# Patient Record
Sex: Female | Born: 1950 | Race: White | Hispanic: No | Marital: Married | Smoking: Never smoker
Health system: Southern US, Community
[De-identification: ages and names within clinical notes are randomized; demographics above are authoritative.]

## PROBLEM LIST (undated history)

## (undated) DIAGNOSIS — Z789 Other specified health status: Secondary | ICD-10-CM

## (undated) HISTORY — PX: TUBAL LIGATION: SHX77

## (undated) HISTORY — PX: NISSEN FUNDOPLICATION: SHX2091

## (undated) HISTORY — PX: APPENDECTOMY: SHX54

---

## 2009-08-23 ENCOUNTER — Emergency Department (HOSPITAL_COMMUNITY): Admission: EM | Admit: 2009-08-23 | Discharge: 2009-08-24 | Payer: Self-pay | Admitting: Emergency Medicine

## 2010-08-19 LAB — URINALYSIS, ROUTINE W REFLEX MICROSCOPIC
Bilirubin Urine: NEGATIVE
Glucose, UA: NEGATIVE mg/dL
Hgb urine dipstick: NEGATIVE
Protein, ur: NEGATIVE mg/dL

## 2010-08-19 LAB — POCT CARDIAC MARKERS
CKMB, poc: <1 ng/mL — ABNORMAL LOW (ref 1.0–8.0)
CKMB, poc: <1 ng/mL — ABNORMAL LOW (ref 1.0–8.0)
Myoglobin, poc: 50.4 ng/mL (ref 12–200)
Myoglobin, poc: 54.5 ng/mL (ref 12–200)
Troponin i, poc: <0.05 ng/mL (ref 0.00–0.09)

## 2010-08-19 LAB — DIFFERENTIAL
Basophils Absolute: 0 10*3/uL (ref 0.0–0.1)
Basophils Relative: 1 % (ref 0–1)
Lymphocytes Relative: 40 % (ref 12–46)
Neutro Abs: 2.6 10*3/uL (ref 1.7–7.7)
Neutrophils Relative %: 47 % (ref 43–77)

## 2010-08-19 LAB — CBC
HCT: 38.8 % (ref 36.0–46.0)
Hemoglobin: 13.5 g/dL (ref 12.0–15.0)
MCV: 86.3 fL (ref 78.0–100.0)
RDW: 13.2 % (ref 11.5–15.5)
WBC: 5.5 10*3/uL (ref 4.0–10.5)

## 2010-08-19 LAB — COMPREHENSIVE METABOLIC PANEL
ALT: 23 U/L (ref 0–35)
AST: 23 U/L (ref 0–37)
BUN: 15 mg/dL (ref 6–23)
CO2: 24 mEq/L (ref 19–32)
GFR calc non Af Amer: >60 mL/min (ref >60)
Glucose, Bld: 89 mg/dL (ref 70–99)

## 2010-08-19 LAB — LIPASE, BLOOD: Lipase: 17 U/L (ref 11–59)

## 2010-09-26 ENCOUNTER — Other Ambulatory Visit: Payer: Self-pay | Admitting: Family Medicine

## 2010-09-26 ENCOUNTER — Other Ambulatory Visit (HOSPITAL_COMMUNITY)
Admission: RE | Admit: 2010-09-26 | Discharge: 2010-09-26 | Disposition: A | Discharge status: Home / Self Care | Payer: BC Managed Care – PPO | Source: Ambulatory Visit | Attending: Family Medicine | Admitting: Family Medicine

## 2010-09-26 DIAGNOSIS — Z124 Encounter for screening for malignant neoplasm of cervix: Secondary | ICD-10-CM | POA: Insufficient documentation

## 2011-06-13 ENCOUNTER — Emergency Department (HOSPITAL_COMMUNITY)
Admission: EM | Admit: 2011-06-13 | Discharge: 2011-06-13 | Disposition: A | Discharge status: Home / Self Care | Payer: BC Managed Care – PPO | Source: Home / Self Care | Attending: Family Medicine | Admitting: Family Medicine

## 2011-06-13 ENCOUNTER — Emergency Department (INDEPENDENT_AMBULATORY_CARE_PROVIDER_SITE_OTHER): Payer: BC Managed Care – PPO

## 2011-06-13 ENCOUNTER — Encounter (HOSPITAL_COMMUNITY): Payer: Self-pay

## 2011-06-13 DIAGNOSIS — S82402A Unspecified fracture of shaft of left fibula, initial encounter for closed fracture: Secondary | ICD-10-CM

## 2011-06-13 DIAGNOSIS — S82409A Unspecified fracture of shaft of unspecified fibula, initial encounter for closed fracture: Secondary | ICD-10-CM

## 2011-06-13 MED ORDER — HYDROCODONE-ACETAMINOPHEN 5-500 MG PO TABS: 1.0000 | ORAL_TABLET | Freq: Four times a day (QID) | ORAL | Status: AC | PRN

## 2011-06-13 NOTE — ED Provider Notes (Signed)
History     CSN: [id number]  Arrival date & time 06/13/11  1010   First MD Initiated Contact with Patient 06/13/11 1026      Chief Complaint  Patient presents with  . Fall    (Consider location/radiation/quality/duration/timing/severity/associated sxs/prior treatment) Patient is a 61 y.o. female presenting with ankle pain. The history is provided by the patient.  Ankle Pain  The incident occurred yesterday. The incident occurred at home. The injury mechanism was a fall. The pain is present in the left ankle. The quality of the pain is described as throbbing. The pain is mild. The pain has been constant since onset. Pertinent negatives include no numbness, no loss of sensation and no tingling. She reports no foreign bodies present.    History reviewed. No pertinent past medical history.  History reviewed. No pertinent past surgical history.  History reviewed. No pertinent family history.  History  Substance Use Topics  . Smoking status: Not on file  . Smokeless tobacco: Not on file  . Alcohol Use: Not on file    OB History    Grav Para Term Preterm Abortions TAB SAB Ect Mult Living                  Review of Systems  Constitutional: Negative.   Musculoskeletal: Positive for joint swelling and gait problem.  Neurological: Negative.  Negative for tingling and numbness.    Allergies  Review of patient's allergies indicates no known allergies.  Home Medications   Current Outpatient Rx  Name Route Sig Dispense Refill  . HYDROCODONE-ACETAMINOPHEN 5-500 MG PO TABS Oral Take 1-2 tablets by mouth every 6 (six) hours as needed for pain. 15 tablet 0    BP 133/53  Pulse 79  Temp(Src) 98.3 F (36.8 C) (Oral)  Resp 20  SpO2 96%  Physical Exam  Nursing note and vitals reviewed. Constitutional: She is oriented to person, place, and time. She appears well-developed and well-nourished.  HENT:  Head: Normocephalic and atraumatic.  Musculoskeletal: She exhibits  tenderness.       Feet:  Neurological: She is alert and oriented to person, place, and time.    ED Course  Procedures (including critical care time)  Labs Reviewed - No data to display Dg Ankle Complete Left  06/13/2011  *RADIOLOGY REPORT*  Clinical Data: Trauma and pain.  LEFT ANKLE COMPLETE - 3+ VIEW  Comparison: None.  Findings: Moderate medial and marked lateral malleolar soft tissue swelling.  Minimally displaced oblique fracture of the distal fibula.  Talar dome and base of fifth metatarsal are intact.  Lucency extending more medially and superiorly within the distal fibula is favored to be a nutrient foramen.  Small joint effusion suspected.  IMPRESSION: Minimally-displaced distal fibular fracture.  Overlying soft tissue swelling.  Original Report Authenticated By: Consuello Bossier, M.D.     1. Closed left fibular fracture       MDM  X-rays reviewed and report per radiologist.         Barkley Bruns, MD 06/13/11 [phone]

## 2011-06-13 NOTE — Discharge Instructions (Signed)
Wear boot when walking, use ice and elevate to reduce swelling over weekend, see orthopedist in 10 days for recheck.

## 2011-06-13 NOTE — ED Notes (Signed)
States she fell last night while walking her dog, injured left foot/ankle. C/o pain w certain positions or weight bearing ; + dorsal pedal pulse ,minimal swelling . Used motrin 800 w some relief. Denies other injury, denies head injury or LOC

## 2012-05-31 ENCOUNTER — Other Ambulatory Visit: Payer: Self-pay | Admitting: Family Medicine

## 2012-05-31 DIAGNOSIS — R1010 Upper abdominal pain, unspecified: Secondary | ICD-10-CM

## 2012-06-03 ENCOUNTER — Ambulatory Visit
Admission: RE | Admit: 2012-06-03 | Discharge: 2012-06-03 | Disposition: A | Discharge status: Home / Self Care | Payer: BC Managed Care – PPO | Source: Ambulatory Visit | Attending: Family Medicine | Admitting: Family Medicine

## 2012-06-03 DIAGNOSIS — R1010 Upper abdominal pain, unspecified: Secondary | ICD-10-CM

## 2014-01-26 ENCOUNTER — Other Ambulatory Visit: Payer: Self-pay | Admitting: Family Medicine

## 2014-01-26 ENCOUNTER — Other Ambulatory Visit (HOSPITAL_COMMUNITY)
Admission: RE | Admit: 2014-01-26 | Discharge: 2014-01-26 | Disposition: A | Discharge status: Home / Self Care | Payer: BC Managed Care – PPO | Source: Ambulatory Visit | Attending: Family Medicine | Admitting: Family Medicine

## 2014-01-26 DIAGNOSIS — Z124 Encounter for screening for malignant neoplasm of cervix: Secondary | ICD-10-CM | POA: Insufficient documentation

## 2014-01-31 LAB — CYTOLOGY - PAP

## 2014-02-01 ENCOUNTER — Other Ambulatory Visit: Payer: Self-pay

## 2014-02-01 DIAGNOSIS — Z1231 Encounter for screening mammogram for malignant neoplasm of breast: Secondary | ICD-10-CM

## 2014-02-06 ENCOUNTER — Ambulatory Visit
Admission: RE | Admit: 2014-02-06 | Discharge: 2014-02-06 | Disposition: A | Discharge status: Home / Self Care | Payer: BC Managed Care – PPO | Source: Ambulatory Visit

## 2014-02-06 ENCOUNTER — Other Ambulatory Visit: Payer: Self-pay

## 2014-02-06 DIAGNOSIS — Z1231 Encounter for screening mammogram for malignant neoplasm of breast: Secondary | ICD-10-CM

## 2014-02-15 ENCOUNTER — Other Ambulatory Visit: Payer: Self-pay | Admitting: Family Medicine

## 2014-02-15 DIAGNOSIS — R928 Other abnormal and inconclusive findings on diagnostic imaging of breast: Secondary | ICD-10-CM

## 2014-02-21 ENCOUNTER — Ambulatory Visit
Admission: RE | Admit: 2014-02-21 | Discharge: 2014-02-21 | Disposition: A | Discharge status: Home / Self Care | Payer: BC Managed Care – PPO | Source: Ambulatory Visit | Attending: Family Medicine | Admitting: Family Medicine

## 2014-02-21 DIAGNOSIS — R928 Other abnormal and inconclusive findings on diagnostic imaging of breast: Secondary | ICD-10-CM

## 2014-07-31 ENCOUNTER — Other Ambulatory Visit: Payer: Self-pay | Admitting: Family Medicine

## 2014-07-31 DIAGNOSIS — N63 Unspecified lump in unspecified breast: Secondary | ICD-10-CM

## 2014-08-14 ENCOUNTER — Ambulatory Visit
Admission: RE | Admit: 2014-08-14 | Discharge: 2014-08-14 | Disposition: A | Discharge status: Home / Self Care | Payer: BLUE CROSS/BLUE SHIELD | Source: Ambulatory Visit | Attending: Family Medicine | Admitting: Family Medicine

## 2014-08-14 DIAGNOSIS — N63 Unspecified lump in unspecified breast: Secondary | ICD-10-CM

## 2015-02-14 ENCOUNTER — Other Ambulatory Visit: Payer: Self-pay | Admitting: Family Medicine

## 2015-02-14 DIAGNOSIS — N6310 Unspecified lump in the right breast, unspecified quadrant: Secondary | ICD-10-CM

## 2015-02-20 ENCOUNTER — Ambulatory Visit
Admission: RE | Admit: 2015-02-20 | Discharge: 2015-02-20 | Disposition: A | Discharge status: Home / Self Care | Payer: BLUE CROSS/BLUE SHIELD | Source: Ambulatory Visit | Attending: Family Medicine | Admitting: Family Medicine

## 2015-02-20 DIAGNOSIS — N6310 Unspecified lump in the right breast, unspecified quadrant: Secondary | ICD-10-CM

## 2015-06-07 ENCOUNTER — Other Ambulatory Visit: Payer: Self-pay | Admitting: Family Medicine

## 2015-06-07 DIAGNOSIS — D329 Benign neoplasm of meninges, unspecified: Secondary | ICD-10-CM

## 2015-06-08 ENCOUNTER — Ambulatory Visit
Admission: RE | Admit: 2015-06-08 | Discharge: 2015-06-08 | Disposition: A | Discharge status: Home / Self Care | Payer: BLUE CROSS/BLUE SHIELD | Source: Ambulatory Visit | Attending: Family Medicine | Admitting: Family Medicine

## 2015-06-08 DIAGNOSIS — D329 Benign neoplasm of meninges, unspecified: Secondary | ICD-10-CM

## 2015-06-08 MED ORDER — GADOBENATE DIMEGLUMINE 529 MG/ML IV SOLN
15.0000 mL | Freq: Once | INTRAVENOUS | Status: AC | PRN
  Administered 2015-06-08: 15 mL via INTRAVENOUS

## 2015-06-22 DIAGNOSIS — D329 Benign neoplasm of meninges, unspecified: Secondary | ICD-10-CM | POA: Insufficient documentation

## 2016-02-20 ENCOUNTER — Other Ambulatory Visit: Payer: Self-pay | Admitting: Family Medicine

## 2016-02-20 DIAGNOSIS — Z1231 Encounter for screening mammogram for malignant neoplasm of breast: Secondary | ICD-10-CM

## 2016-02-25 ENCOUNTER — Ambulatory Visit
Admission: RE | Admit: 2016-02-25 | Discharge: 2016-02-25 | Disposition: A | Discharge status: Home / Self Care | Payer: BLUE CROSS/BLUE SHIELD | Source: Ambulatory Visit | Attending: Family Medicine | Admitting: Family Medicine

## 2016-02-25 DIAGNOSIS — Z1231 Encounter for screening mammogram for malignant neoplasm of breast: Secondary | ICD-10-CM

## 2016-02-26 IMAGING — MR MR HEAD WO/W CM
12 of 13 series · 45 of 48 positions shown · IV contrast (15ml multihance)
Comparison: None.

CLINICAL DATA: Meningioma on outside head CT. Recent fall with
headache.

Creatinine was obtained on site at [HOSPITAL] at [HOSPITAL].
Results: Creatinine 0.7 mg/dL.
EXAM:
MRI HEAD WITHOUT AND WITH CONTRAST
TECHNIQUE: Multiplanar, multiecho pulse sequences of the brain and surrounding
structures were obtained without and with intravenous contrast.
CONTRAST:  15mL MULTIHANCE GADOBENATE DIMEGLUMINE 529 MG/ML IV SOLN

[Series 2: T1 · sagittal · 5.0mm · 0.45mm/px · 1 of 21 slices shown]
[im 1/21]
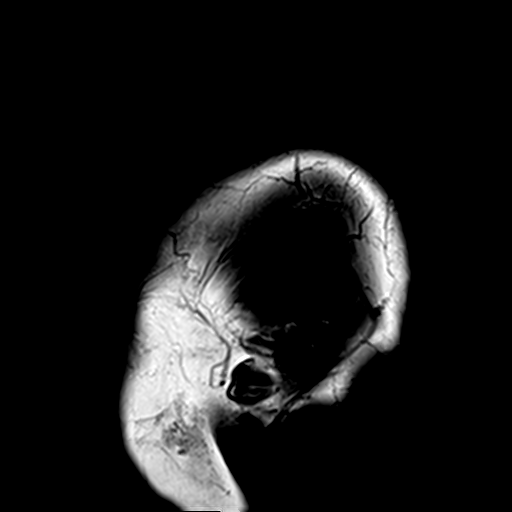

[Series 3: DWI · axial · 3.0mm · 1.80mm/px · z∈[-69,+77]mm · 7 of 100 slices shown (1 of 4)]
[im 1/100]
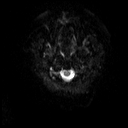
[im 17/100]
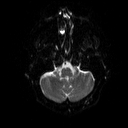
[im 34/100]
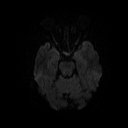
[im 50/100]
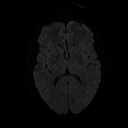
[im 67/100]
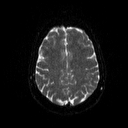
[im 83/100]
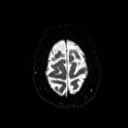
[im 100/100]
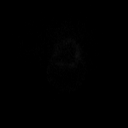

[Series 4: DWI · axial · 3.0mm · 1.80mm/px · z∈[-69,+77]mm · 4 of 48 slices shown (2 of 4)]
[im 1/48]
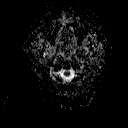
[im 16/48]
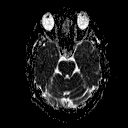
[im 32/48]
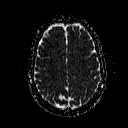
[im 48/48]
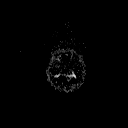

[Series 6: swi_images · axial · 2.0mm · 0.90mm/px · z∈[-75,+83]mm · 6 of 80 slices shown]
[im 1/80]
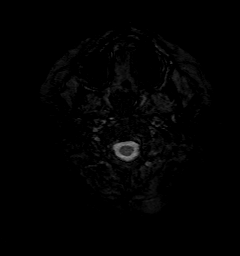
[im 16/80]
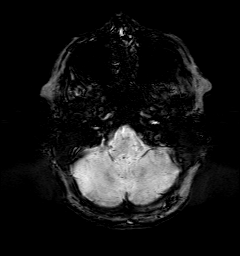
[im 32/80]
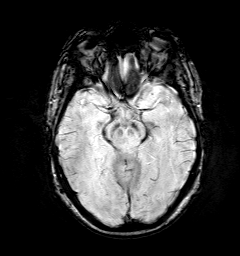
[im 48/80]
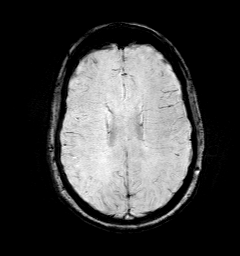
[im 64/80]
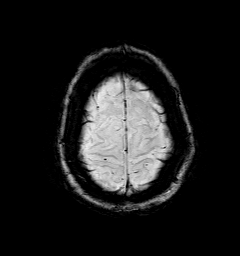
[im 80/80]
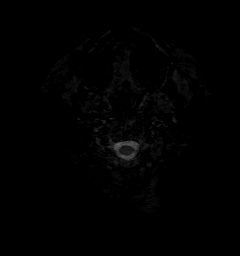

[Series 7: DWI · coronal · 5.0mm · 1.80mm/px · 5 of 64 slices shown (3 of 4)]
[im 1/64]
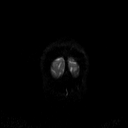
[im 16/64]
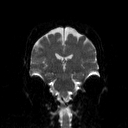
[im 32/64]
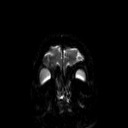
[im 48/64]
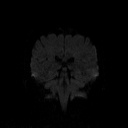
[im 64/64]
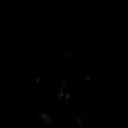

[Series 8: DWI · coronal · 5.0mm · 1.80mm/px · 3 of 34 slices shown (4 of 4)]
[im 1/34]
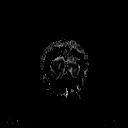
[im 17/34]
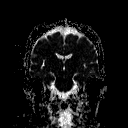
[im 34/34]
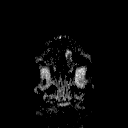

[Series 9: T2 · axial · 5.0mm · 0.51mm/px · z∈[-66,+75]mm · 2 of 22 slices shown (1 of 2)]
[im 1/22]
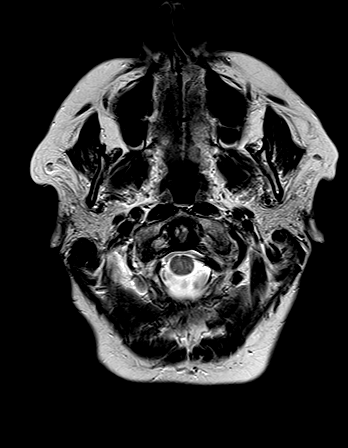
[im 22/22]
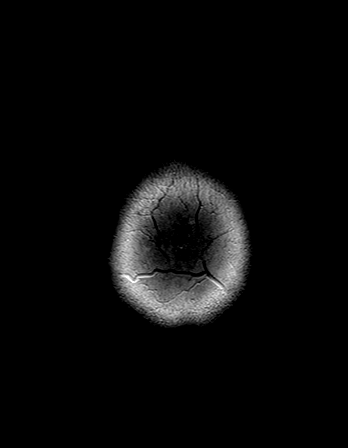

[Series 10: FLAIR · axial · 5.0mm · 0.45mm/px · z∈[-67,+74]mm · 2 of 22 slices shown]
[im 1/22]
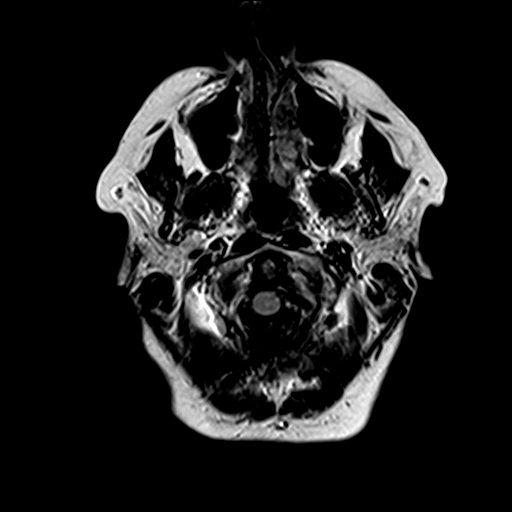
[im 22/22]
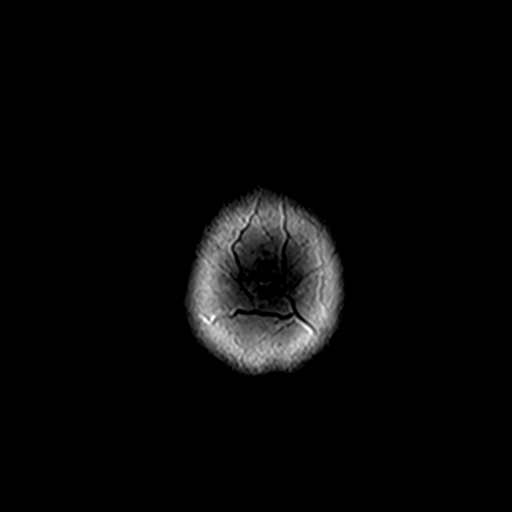

[Series 11: t1_mpr_tra · axial · 2.0mm · 0.45mm/px · z∈[-66,+76]mm · 6 of 72 slices shown (1 of 2)]
[im 1/72]
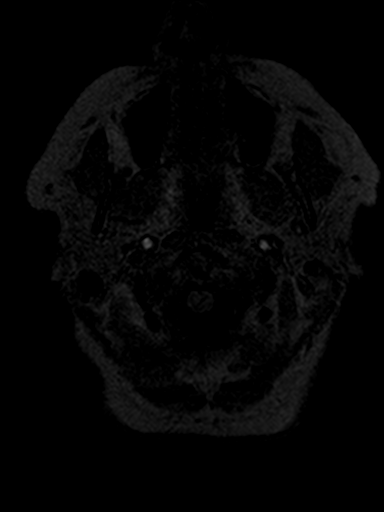
[im 15/72]
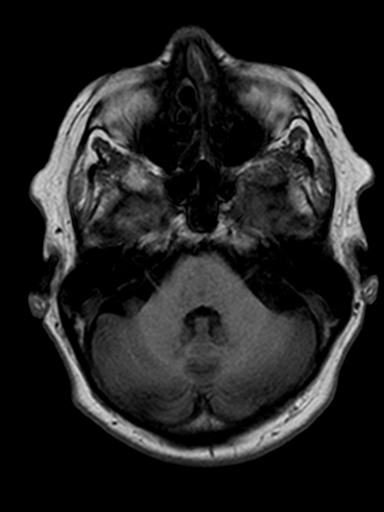
[im 29/72]
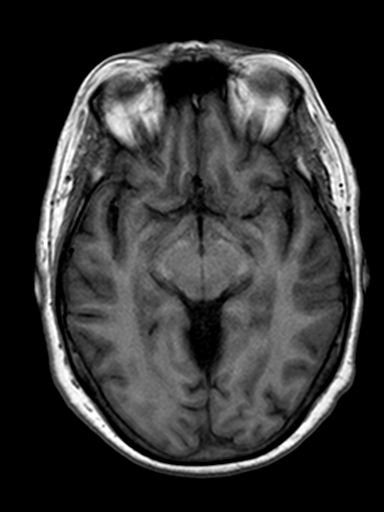
[im 43/72]
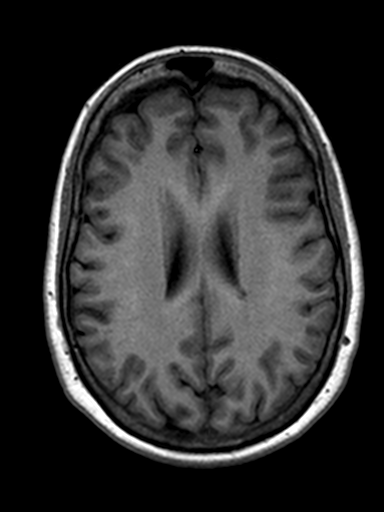
[im 57/72]
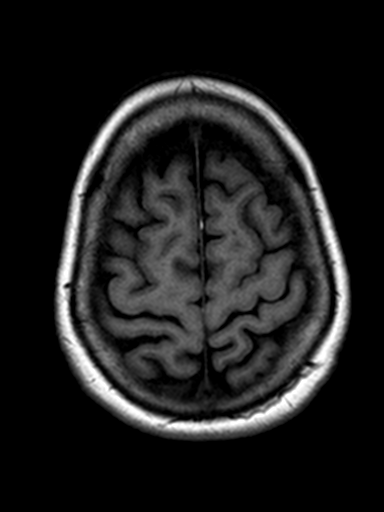
[im 72/72]
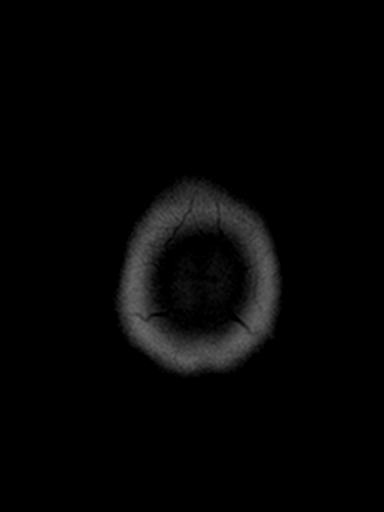

[Series 12: T2 · coronal · 5.0mm · 0.45mm/px · 2 of 29 slices shown (2 of 2)]
[im 1/29]
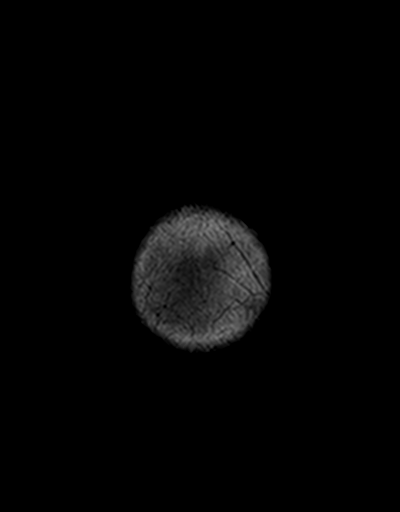
[im 29/29]
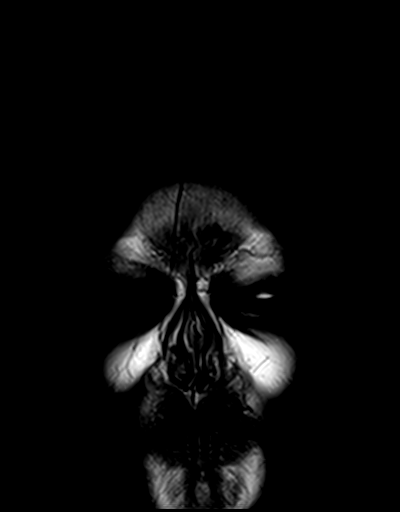

[Series 13: t1_mpr_tra · axial · 2.0mm · 0.45mm/px · z∈[-66,+76]mm · 6 of 72 slices shown (2 of 2)]
[im 1/72]
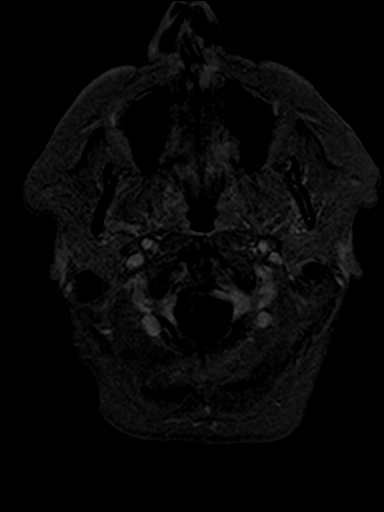
[im 15/72]
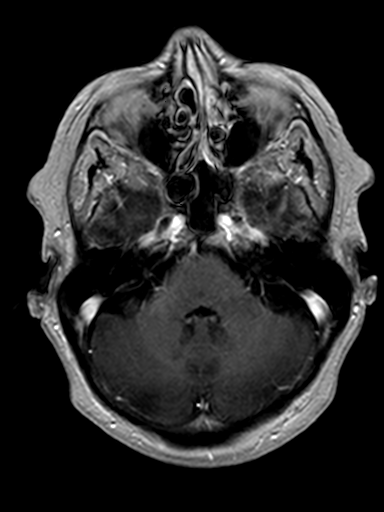
[im 29/72]
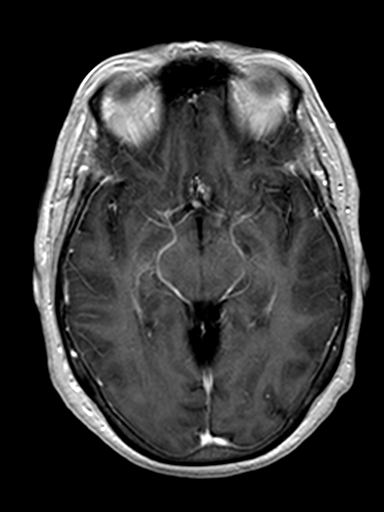
[im 43/72]
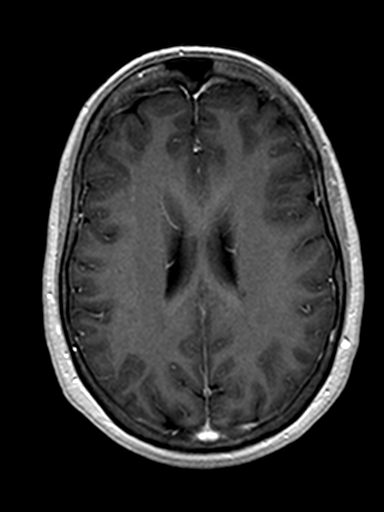
[im 57/72]
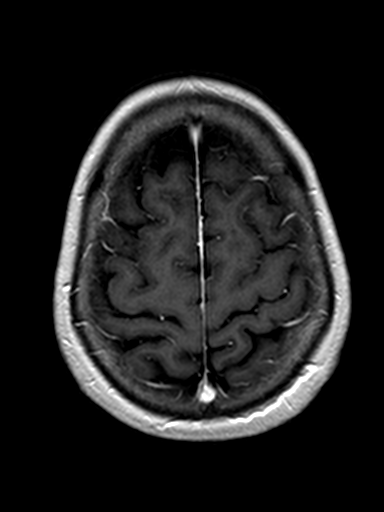
[im 72/72]
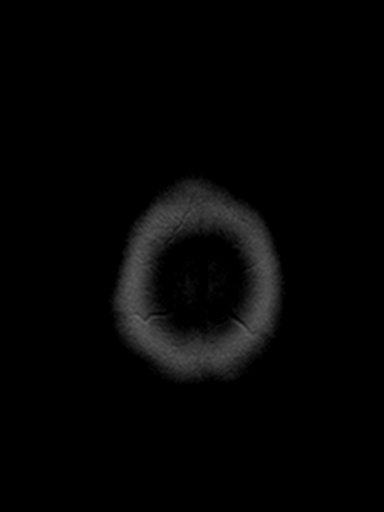

[Series 14: post cor · coronal · 5.0mm · 0.45mm/px · 1 of 29 slices shown]
[im 1/29]
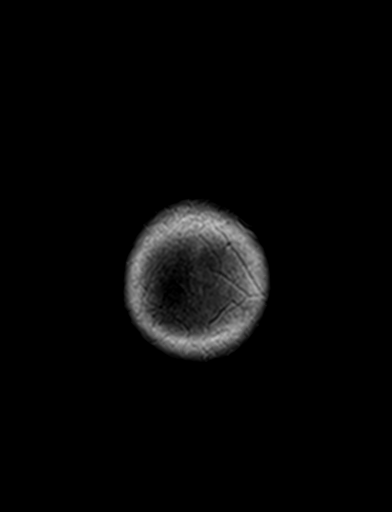

[45 of 48 positions shown; findings below may reference images not displayed]

FINDINGS: There is no evidence of acute infarct, intracranial hemorrhage,
midline shift, or extra-axial fluid collection. Ventricles and sulci
are normal. The brain is normal in signal.

There is a homogeneously enhancing extra-axial soft tissue mass in
the midline along the tuberculum sellae which measures 1.9 x 0.7 x
1.4 cm. The inferior aspect of the mass is in close proximity but
appears separate from the pituitary gland (series 2, image 11). The
mass may partially encase portions of the proximal anterior cerebral
arteries. The mass appears to contact and slightly posteriorly
displace the distal aspect of the pituitary infundibulum. No gross
mass effect is identified on the optic chiasm or prechiasmatic optic
nerves.

Orbits are unremarkable. Trace left mastoid fluid is noted. The
paranasal sinuses are clear. Major intracranial vascular flow voids
are preserved.
IMPRESSION: 1.9 cm tuberculum sellae meningioma as above.

## 2017-03-16 ENCOUNTER — Other Ambulatory Visit: Payer: Self-pay

## 2017-03-16 ENCOUNTER — Other Ambulatory Visit: Payer: Self-pay | Admitting: Family Medicine

## 2017-03-16 DIAGNOSIS — Z1231 Encounter for screening mammogram for malignant neoplasm of breast: Secondary | ICD-10-CM

## 2018-06-02 DIAGNOSIS — Z789 Other specified health status: Secondary | ICD-10-CM | POA: Insufficient documentation

## 2018-06-02 DIAGNOSIS — M79671 Pain in right foot: Secondary | ICD-10-CM | POA: Insufficient documentation

## 2018-06-07 DIAGNOSIS — R531 Weakness: Secondary | ICD-10-CM | POA: Insufficient documentation

## 2019-06-12 DIAGNOSIS — E7800 Pure hypercholesterolemia, unspecified: Secondary | ICD-10-CM | POA: Insufficient documentation

## 2019-06-14 DIAGNOSIS — M7661 Achilles tendinitis, right leg: Secondary | ICD-10-CM | POA: Insufficient documentation

## 2019-07-12 DIAGNOSIS — Z4889 Encounter for other specified surgical aftercare: Secondary | ICD-10-CM | POA: Insufficient documentation

## 2019-08-19 DIAGNOSIS — R269 Unspecified abnormalities of gait and mobility: Secondary | ICD-10-CM | POA: Insufficient documentation

## 2021-02-14 DIAGNOSIS — E782 Mixed hyperlipidemia: Secondary | ICD-10-CM | POA: Insufficient documentation

## 2021-02-14 DIAGNOSIS — R0789 Other chest pain: Secondary | ICD-10-CM | POA: Insufficient documentation

## 2023-10-13 ENCOUNTER — Ambulatory Visit: Admission: EM | Admit: 2023-10-13 | Discharge: 2023-10-13 | Disposition: A | Discharge status: Home / Self Care

## 2023-10-13 ENCOUNTER — Ambulatory Visit (INDEPENDENT_AMBULATORY_CARE_PROVIDER_SITE_OTHER)

## 2023-10-13 DIAGNOSIS — S9781XA Crushing injury of right foot, initial encounter: Secondary | ICD-10-CM

## 2023-10-13 DIAGNOSIS — S9031XA Contusion of right foot, initial encounter: Secondary | ICD-10-CM | POA: Diagnosis not present

## 2023-10-13 DIAGNOSIS — F5101 Primary insomnia: Secondary | ICD-10-CM | POA: Insufficient documentation

## 2023-10-13 DIAGNOSIS — Z6832 Body mass index (BMI) 32.0-32.9, adult: Secondary | ICD-10-CM | POA: Insufficient documentation

## 2023-10-13 HISTORY — DX: Other specified health status: Z78.9

## 2023-10-13 NOTE — ED Triage Notes (Signed)
 Pt states she dropped a heavy log on the top of her right foot. Since then she has had right foot pain, right foot swelling, and tingling/burning in her toes.   Start Date: 10/12/2023  Home Interventions: Ibuprofen ,last dose yesterday

## 2023-10-13 NOTE — Discharge Instructions (Addendum)
 Remarkably, we received the report of your x-ray and it did not show any acute bony injury.  You do have thinning of your bones, also known as osteopenia and some mild arthritis in the joints of your foot.  I still recommend that you wear this walking boot that we have provided for you today for comfort until your foot is feeling better.  It may be several weeks before the bruising completely goes away.  I also recommend that you ice your foot and keep it elevated is much as possible to help reduce swelling and pain.  You are welcome to take ibuprofen 400 mg every 6-8 hours as needed to reduce inflammation causing pain, if you tolerate ibuprofen.  If not, you can certainly take Tylenol  1000 mg every 8 hours around-the-clock to lessen your pain.

## 2023-10-13 NOTE — ED Provider Notes (Signed)
 [facility]    CSN: [id number] Arrival date & time: 10/13/23  1804    HISTORY   Chief Complaint  Patient presents with   Foot Injury   HPI Diamond Chapman is a pleasant, 73 y.o. female who presents to urgent care today. Patient states that yesterday she attempted to lift a 5 foot log which was about 5 inches in diameter by one of the branches coming off of it, states that when she lifted it the branch snapped in the log fell on top of her right foot.  Patient states the log was about 18 inches off the ground when it fell.  Patient states she has been able to walk on her right foot since then but is having significant right foot pain, swelling, and tingling and burning in her right toes.  Patient denies prior injury to her right foot, loss of range of motion, numbness or loss of sensation.  The history is provided by the patient.  Foot Injury  Past Medical History:  Diagnosis Date   No pertinent family history    Patient Active Problem List   Diagnosis Date Noted   Body mass index (BMI) 32.0-32.9, adult 10/13/2023   Primary insomnia 10/13/2023   Mixed hyperlipidemia 02/14/2021   Other chest pain 02/14/2021   Abnormality of gait 08/19/2019   Aftercare following surgery 07/12/2019   Achilles tendinitis of right lower extremity 06/14/2019   High cholesterol 06/12/2019   Weakness 06/07/2018   Right foot pain 06/02/2018   Tobacco non-user 06/02/2018   Benign neoplasm of meninges (HCC) 06/22/2015   Past Surgical History:  Procedure Laterality Date   APPENDECTOMY     NISSEN FUNDOPLICATION     TUBAL LIGATION     OB History   No obstetric history on file.    Home Medications    Prior to Admission medications   Medication Sig Start Date End Date Taking? Authorizing Provider  atorvastatin (LIPITOR) 80 MG tablet  05/03/19  Yes [provider]  azithromycin (ZITHROMAX) 250 MG tablet 2 tablets today and 1 tablet on days 2-5 Orally every day (qd) for 5 days  07/06/23  Yes [provider]  cephALEXin (KEFLEX) 500 MG capsule  05/03/19  Yes [provider]  cetirizine (ZYRTEC) 10 MG tablet Take 10 mg by mouth. 05/03/19  Yes [provider]  cyclobenzaprine (FLEXERIL) 10 MG tablet TAKE 1 TABLET 3 TIMES A DAY FOR 5 DAYS 11/21/19  Yes [provider]  etodolac (LODINE) 400 MG tablet TAKE 1 TABLET TWICE DAILY FOR 14 DAYS AS NEEDED FOR PAIN 11/21/19  Yes [provider]  HYDROcodone -acetaminophen  (NORCO/VICODIN) 5-325 MG tablet Take 1 tablet by mouth. 07/06/19  Yes [provider]  meloxicam (MOBIC) 7.5 MG tablet  03/19/20  Yes [provider]  naproxen (NAPROSYN) 500 MG tablet  05/25/18  Yes [provider]  predniSONE (DELTASONE) 20 MG tablet Take 40 mg by mouth daily. 04/30/23  Yes [provider]  promethazine-dextromethorphan (PROMETHAZINE-DM) 6.25-15 MG/5ML syrup Take 5 mLs by mouth. 08/11/22  Yes [provider]  traMADol (ULTRAM) 50 MG tablet Take 50 mg by mouth. 07/26/19  Yes [provider]  traZODone (DESYREL) 50 MG tablet 0.5-1 tablet Orally at bedtime as needed for 30 days 07/06/23  Yes [provider]  ibuprofen (ADVIL) 200 MG tablet Take 200 mg by mouth.    [provider]    Family History History reviewed. No pertinent family history. Social History Social History   Tobacco  Use   Smoking status: Never    Passive exposure: Past   Smokeless tobacco: Never  Vaping Use   Vaping status: Never Used  Substance Use Topics   Alcohol use: Never   Drug use: Never   Allergies   Prednisone and Pollen extract  Review of Systems Review of Systems Pertinent findings revealed after performing a 14 point review of systems has been noted in the history of present illness.  Physical Exam Vital Signs BP (!) 159/69 (BP Location: Right Arm)   Pulse 72   Temp 99.4 F (37.4 C) (Oral)   Resp 15   SpO2 95%   No data found.  Physical  Exam Vitals and nursing note reviewed.  Constitutional:      General: She is not in acute distress.    Appearance: Normal appearance.  Musculoskeletal:     Right foot: Normal range of motion and normal capillary refill. Swelling and tenderness present. No deformity.       Feet:  Neurological:     Mental Status: She is alert.     Visual Acuity Right Eye Distance:   Left Eye Distance:   Bilateral Distance:    Right Eye Near:   Left Eye Near:    Bilateral Near:     [facility] Couse / Diagnostics / Procedures:     Radiology DG Foot Complete Right Result Date: 10/13/2023 CLINICAL DATA:  dropped a log on foot yesterday, now with pain and swelling. EXAM: RIGHT FOOT COMPLETE - 3+ VIEW COMPARISON:  None Available. FINDINGS: There is diffuse osteopenia of the visualized osseous structures. No acute fracture or dislocation. No aggressive osseous lesion. Mild diffuse degenerative changes of imaged joints. Calcaneal spur noted along the Plantar aponeurosis attachment site. The No focal soft tissue swelling. No radiopaque foreign bodies. IMPRESSION: *No acute osseous abnormality of the right foot. Electronically Signed   By: Beula Brunswick M.D.   On: 10/13/2023 18:32    Procedures Procedures (including critical care time) EKG  Pending results:  Labs Reviewed - No data to display  Medications Ordered in UC: Medications - No data to display  UC Diagnoses / Final Clinical Impressions(s)   I have reviewed the triage vital signs and the nursing notes.  Pertinent labs & imaging results that were available during my care of the patient were reviewed by me and considered in my medical decision making (see chart for details).    Final diagnoses:  Crushing injury of right foot, initial encounter  Contusion of right foot, initial encounter   Patient advised x-ray was negative for acute bony injury.  Patient advised of findings of osteopenia and mild arthritis.  Patient provided with a postop shoe  for comfort.  Patient advised to keep it elevated and apply ice is much as possible to get swelling down.  Patient states she can also take ibuprofen if she tolerates it.  Conservative care recommended.  Return precautions advised.  Please see discharge instructions below for details of plan of care as provided to patient. ED Prescriptions   None    PDMP not reviewed this encounter.  Pending results:  Labs Reviewed - No data to display    Discharge Instructions      Remarkably, we received the report of your x-ray and it did not show any acute bony injury.  You do have thinning of your bones, also known as osteopenia and some mild arthritis in the joints of your foot.  I still recommend that you wear this walking  boot that we have provided for you today for comfort until your foot is feeling better.  It may be several weeks before the bruising completely goes away.  I also recommend that you ice your foot and keep it elevated is much as possible to help reduce swelling and pain.  You are welcome to take ibuprofen 400 mg every 6-8 hours as needed to reduce inflammation causing pain, if you tolerate ibuprofen.  If not, you can certainly take Tylenol  1000 mg every 8 hours around-the-clock to lessen your pain.    Disposition Upon Discharge:  Condition: stable for discharge home  Patient presented with an acute illness with associated systemic symptoms and significant discomfort requiring urgent management. In my opinion, this is a condition that a prudent lay person (someone who possesses an average knowledge of health and medicine) may potentially expect to result in complications if not addressed urgently such as respiratory distress, impairment of bodily function or dysfunction of bodily organs.   Routine symptom specific, illness specific and/or disease specific instructions were discussed with the patient and/or caregiver at length.   As such, the patient has been evaluated and  assessed, work-up was performed and treatment was provided in alignment with urgent care protocols and evidence based medicine.  Patient/parent/caregiver has been advised that the patient may require follow up for further testing and treatment if the symptoms continue in spite of treatment, as clinically indicated and appropriate.  Patient/parent/caregiver has been advised to return to the [facility] or PCP if no better; to PCP or the Emergency Department if new signs and symptoms develop, or if the current signs or symptoms continue to change or worsen for further workup, evaluation and treatment as clinically indicated and appropriate  The patient will follow up with their current PCP if and as advised. If the patient does not currently have a PCP we will assist them in obtaining one.   The patient may need specialty follow up if the symptoms continue, in spite of conservative treatment and management, for further workup, evaluation, consultation and treatment as clinically indicated and appropriate.  Patient/parent/caregiver verbalized understanding and agreement of plan as discussed.  All questions were addressed during visit.  Please see discharge instructions below for further details of plan.  This office note has been dictated using Teaching laboratory technician.  Unfortunately, this method of dictation can sometimes lead to typographical or grammatical errors.  I apologize for your inconvenience in advance if this occurs.  Please do not hesitate to reach out to me if clarification is needed.      Eloise Hake Scales, [place] 10/13/23 [phone]
